# Patient Record
Sex: Male | Born: 1980 | Race: White | Hispanic: No | Marital: Married | Smoking: Current every day smoker
Health system: Southern US, Community
[De-identification: ages and names within clinical notes are randomized; demographics above are authoritative.]

## PROBLEM LIST (undated history)

## (undated) HISTORY — PX: APPENDECTOMY: SHX54

## (undated) HISTORY — PX: BACK SURGERY: SHX140

---

## 2015-04-14 ENCOUNTER — Encounter: Payer: Self-pay | Admitting: Emergency Medicine

## 2015-04-14 ENCOUNTER — Emergency Department
Admission: EM | Admit: 2015-04-14 | Discharge: 2015-04-14 | Disposition: A | Discharge status: Home / Self Care | Payer: Self-pay | Attending: Emergency Medicine | Admitting: Emergency Medicine

## 2015-04-14 DIAGNOSIS — Z720 Tobacco use: Secondary | ICD-10-CM | POA: Insufficient documentation

## 2015-04-14 DIAGNOSIS — L0291 Cutaneous abscess, unspecified: Secondary | ICD-10-CM

## 2015-04-14 DIAGNOSIS — L02415 Cutaneous abscess of right lower limb: Secondary | ICD-10-CM | POA: Insufficient documentation

## 2015-04-14 MED ORDER — SULFAMETHOXAZOLE-TRIMETHOPRIM 800-160 MG PO TABS
2.0000 | ORAL_TABLET | Freq: Once | ORAL | Status: AC
  Administered 2015-04-14: 2 via ORAL
  Filled 2015-04-14: qty 2

## 2015-04-14 MED ORDER — LIDOCAINE HCL (PF) 1 % IJ SOLN
INTRAMUSCULAR | Status: AC
  Administered 2015-04-14: 5 mL via INTRADERMAL
  Filled 2015-04-14: qty 5

## 2015-04-14 MED ORDER — LIDOCAINE HCL (PF) 1 % IJ SOLN
5.0000 mL | Freq: Once | INTRAMUSCULAR | Status: AC
  Administered 2015-04-14: 5 mL via INTRADERMAL

## 2015-04-14 MED ORDER — SULFAMETHOXAZOLE-TRIMETHOPRIM 800-160 MG PO TABS: 2.0000 | ORAL_TABLET | Freq: Two times a day (BID) | ORAL | Status: AC

## 2015-04-14 NOTE — ED Provider Notes (Signed)
[facility] Emergency Department Provider Note  ____________________________________________  Time seen: Approximately 3:07 AM  I have reviewed the triage vital signs and the nursing notes.   HISTORY  Chief Complaint Abscess    HPI Vincent Vazquez is a 34 y.o. male who presents to the ED from home with a chief complaint of abscess. Patient states he has a "recurring cyst" on the back of his right leg that he has had I&D'd about 6 times previously.Noted pain and swelling approximately one week ago. Denies fever, vomiting or drainage. Nothing makes the pain or swelling better or worse.   History reviewed. No pertinent past medical history.  Denies diabetes  There are no active problems to display for this patient.   Past Surgical History  Procedure Laterality Date  . Back surgery    . Appendectomy      No current outpatient prescriptions on file.  Allergies Review of patient's allergies indicates no known allergies.  History reviewed. No pertinent family history.  Social History Social History  Substance Use Topics  . Smoking status: Current Every Day Smoker -- 1.00 packs/day    Types: Cigarettes  . Smokeless tobacco: None  . Alcohol Use: Yes    Review of Systems Constitutional: No fever/chills Eyes: No visual changes. ENT: No sore throat. Cardiovascular: Denies chest pain. Respiratory: Denies shortness of breath. Gastrointestinal: No abdominal pain.  No nausea, no vomiting.  No diarrhea.  No constipation. Genitourinary: Negative for dysuria. Musculoskeletal: Negative for back pain. Skin: Positive for abscess. Negative for rash. Neurological: Negative for headaches, focal weakness or numbness.  10-point ROS otherwise negative.  ____________________________________________   PHYSICAL EXAM:  VITAL SIGNS: ED Triage Vitals  Enc Vitals Group     BP 04/14/15 0230 135/87 mmHg     Pulse Rate 04/14/15 0230 73     Resp 04/14/15 0230 18     Temp 04/14/15 0230 97.8 F (36.6 C)     Temp Source 04/14/15 0230 Oral     SpO2 04/14/15 0230 98 %     Weight 04/14/15 0230 175 lb (79.379 kg)     Height 04/14/15 0230  (1.905 m)     Head Cir --      Peak Flow --      Pain Score 04/14/15 0228 7     Pain Loc --      Pain Edu? --      Excl. in GC? --     Constitutional: Alert and oriented. Well appearing and in no acute distress. Eyes: Conjunctivae are normal. PERRL. EOMI. Head: Atraumatic. Nose: No congestion/rhinnorhea. Mouth/Throat: Mucous membranes are moist.  Oropharynx non-erythematous. Neck: No stridor.   Cardiovascular: Normal rate, regular rhythm. Grossly normal heart sounds.  Good peripheral circulation. Respiratory: Normal respiratory effort.  No retractions. Lungs CTAB. Gastrointestinal: Soft and nontender. No distention. No abdominal bruits. No CVA tenderness. Musculoskeletal: Approximately 1 cm area of abscess posterior right thigh. Tiny amount of fluctuance appreciated. No surrounding warmth or erythema. Neurologic:  Normal speech and language. No gross focal neurologic deficits are appreciated. No gait instability. Skin:  Skin is warm, dry and intact. No rash noted. Psychiatric: Mood and affect are normal. Speech and behavior are normal.  ____________________________________________   LABS (all labs ordered are listed, but only abnormal results are displayed)  Labs Reviewed - No data to display ____________________________________________  EKG  None ____________________________________________  RADIOLOGY  None ____________________________________________   PROCEDURES  Procedure(s) performed:   INCISION AND DRAINAGE Performed by: Irean Hong  Consent: Verbal consent obtained. Risks and benefits: risks, benefits and alternatives were discussed Type: abscess  Body area: Right posterior thigh  Anesthesia: local infiltration  Incision was made with a scalpel.  Local anesthetic: lidocaine  1% no epinephrine  Anesthetic total: 3 ml  Complexity: complex Blunt dissection to break up loculations  Drainage: purulent  Drainage amount: small  Packing material: 1/4 in iodoform gauze  Patient tolerance: Patient tolerated the procedure well with no immediate complications.    Critical Care performed: No  ____________________________________________   INITIAL IMPRESSION / ASSESSMENT AND PLAN / ED COURSE  Pertinent labs & imaging results that were available during my care of the patient were reviewed by me and considered in my medical decision making (see chart for details).  34 year old male who presents with small abscess to posterior right thigh s/p I&D. Will place on antibiotics and patient will follow-up with his PCP. Strict return precautions given. Patient verbalizes understanding and agrees with plan of care. ____________________________________________   FINAL CLINICAL IMPRESSION(S) / ED DIAGNOSES  Final diagnoses:  Abscess      Irean Hong, MD 04/14/15 [id number]

## 2015-04-14 NOTE — Discharge Instructions (Signed)
1. Take antibiotic as prescribed (Bactrim DS 2 tablets twice daily 10 days). 2. Do not replace packing if it falls out. 3. Wound check in 2 days. 4. Return to the ER for worsening symptoms, fever, persistent vomiting or other concerns.  Abscess An abscess is an infected area that contains a collection of pus and debris.It can occur in almost any part of the body. An abscess is also known as a furuncle or boil. CAUSES  An abscess occurs when tissue gets infected. This can occur from blockage of oil or sweat glands, infection of hair follicles, or a minor injury to the skin. As the body tries to fight the infection, pus collects in the area and creates pressure under the skin. This pressure causes pain. People with weakened immune systems have difficulty fighting infections and get certain abscesses more often.  SYMPTOMS Usually an abscess develops on the skin and becomes a painful mass that is red, warm, and tender. If the abscess forms under the skin, you may feel a moveable soft area under the skin. Some abscesses break open (rupture) on their own, but most will continue to get worse without care. The infection can spread deeper into the body and eventually into the bloodstream, causing you to feel ill.  DIAGNOSIS  Your caregiver will take your medical history and perform a physical exam. A sample of fluid may also be taken from the abscess to determine what is causing your infection. TREATMENT  Your caregiver may prescribe antibiotic medicines to fight the infection. However, taking antibiotics alone usually does not cure an abscess. Your caregiver may need to make a small cut (incision) in the abscess to drain the pus. In some cases, gauze is packed into the abscess to reduce pain and to continue draining the area. HOME CARE INSTRUCTIONS   Only take over-the-counter or prescription medicines for pain, discomfort, or fever as directed by your caregiver.  If you were prescribed antibiotics,  take them as directed. Finish them even if you start to feel better.  If gauze is used, follow your caregiver's directions for changing the gauze.  To avoid spreading the infection:  Keep your draining abscess covered with a bandage.  Wash your hands well.  Do not share personal care items, towels, or whirlpools with others.  Avoid skin contact with others.  Keep your skin and clothes clean around the abscess.  Keep all follow-up appointments as directed by your caregiver. SEEK MEDICAL CARE IF:   You have increased pain, swelling, redness, fluid drainage, or bleeding.  You have muscle aches, chills, or a general ill feeling.  You have a fever. MAKE SURE YOU:   Understand these instructions.  Will watch your condition.  Will get help right away if you are not doing well or get worse.   This information is not intended to replace advice given to you by your health care provider. Make sure you discuss any questions you have with your health care provider.   Document Released: 04/01/2005 Document Revised: 12/22/2011 Document Reviewed: 09/04/2011 Elsevier Interactive Patient Education 2016 [employer]  Incision and Drainage Incision and drainage is a procedure in which a sac-like structure (cystic structure) is opened and drained. The area to be drained usually contains material such as pus, fluid, or blood.  LET YOUR CAREGIVER KNOW ABOUT:   Allergies to medicine.  Medicines taken, including vitamins, herbs, eyedrops, over-the-counter medicines, and creams.  Use of steroids (by mouth or creams).  Previous problems with anesthetics or numbing  medicines.  History of bleeding problems or blood clots.  Previous surgery.  Other health problems, including diabetes and kidney problems.  Possibility of pregnancy, if this applies. RISKS AND COMPLICATIONS  Pain.  Bleeding.  Scarring.  Infection. BEFORE THE PROCEDURE  You may need to have an ultrasound or other  imaging tests to see how large or deep your cystic structure is. Blood tests may also be used to determine if you have an infection or how severe the infection is. You may need to have a tetanus shot. PROCEDURE  The affected area is cleaned with a cleaning fluid. The cyst area will then be numbed with a medicine (local anesthetic). A small incision will be made in the cystic structure. A syringe or catheter may be used to drain the contents of the cystic structure, or the contents may be squeezed out. The area will then be flushed with a cleansing solution. After cleansing the area, it is often gently packed with a gauze or another wound dressing. Once it is packed, it will be covered with gauze and tape or some other type of wound dressing. AFTER THE PROCEDURE   Often, you will be allowed to go home right after the procedure.  You may be given antibiotic medicine to prevent or heal an infection.  If the area was packed with gauze or some other wound dressing, you will likely need to come back in 1 to 2 days to get it removed.  The area should heal in about 14 days.   This information is not intended to replace advice given to you by your health care provider. Make sure you discuss any questions you have with your health care provider.   Document Released: 12/16/2000 Document Revised: 12/22/2011 Document Reviewed: 08/17/2011 Elsevier Interactive Patient Education [employer]

## 2015-04-14 NOTE — ED Notes (Addendum)
Pt says he has a "recurring cyst" on the back of his right leg that he needs to have I & D'd; has had to have drained about 6 times previously; presently no drainage

## 2015-04-14 NOTE — ED Notes (Signed)
Sung, MD at bedside. 

## 2015-07-04 ENCOUNTER — Emergency Department: Payer: Self-pay

## 2015-07-04 ENCOUNTER — Emergency Department
Admission: EM | Admit: 2015-07-04 | Discharge: 2015-07-04 | Disposition: A | Discharge status: Home / Self Care | Payer: Self-pay | Attending: Emergency Medicine | Admitting: Emergency Medicine

## 2015-07-04 DIAGNOSIS — F17210 Nicotine dependence, cigarettes, uncomplicated: Secondary | ICD-10-CM | POA: Insufficient documentation

## 2015-07-04 DIAGNOSIS — R609 Edema, unspecified: Secondary | ICD-10-CM

## 2015-07-04 DIAGNOSIS — N4342 Spermatocele of epididymis, multiple: Secondary | ICD-10-CM | POA: Insufficient documentation

## 2015-07-04 DIAGNOSIS — Z792 Long term (current) use of antibiotics: Secondary | ICD-10-CM | POA: Insufficient documentation

## 2015-07-04 LAB — URINALYSIS COMPLETE WITH MICROSCOPIC (ARMC ONLY)
BILIRUBIN URINE: NEGATIVE
Bacteria, UA: NONE SEEN
GLUCOSE, UA: NEGATIVE mg/dL
Hgb urine dipstick: NEGATIVE
KETONES UR: NEGATIVE mg/dL
Leukocytes, UA: NEGATIVE
NITRITE: NEGATIVE
PROTEIN: NEGATIVE mg/dL
SPECIFIC GRAVITY, URINE: 1.001 — AB (ref 1.005–1.030)
Squamous Epithelial / LPF: NONE SEEN
WBC UA: NONE SEEN WBC/hpf (ref 0–5)
pH: 6 (ref 5.0–8.0)

## 2015-07-04 MED ORDER — NAPROXEN 500 MG PO TABS: 500.0000 mg | ORAL_TABLET | Freq: Two times a day (BID) | ORAL | Status: AC

## 2015-07-04 NOTE — ED Provider Notes (Signed)
[facility] Emergency Department Provider Note  ____________________________________________  Time seen: Approximately 8:17 PM  I have reviewed the triage vital signs and the nursing notes.   HISTORY  Chief Complaint Testicle Pain  Patient complaining of right testicle pain for 1 month. He denies any provocative incident. He is noticing edema in the right scrotum. He denies any dysuria or penial discharge. He denies any inguinal swelling or pain. Patient rates the pain as a 5/10 and describes the pain as "dull to sharp". No palliative measures taken this complaint.  HPI Vincent AxeBrian Hutmacher is a 34 y.o. male patient complaining of right testicle pain for 1 month. Patient denies any provocative incident. Patient noticed increased edema to the right scrotum. Patient denies any dysuria or penial discharge. Patient denies any inguinal swelling or pain. Patient rated his scrotum pain as a 5/10. Patient describes his pain as "dull to sharp". No palliative measures taken for this complaint.   History reviewed. No pertinent past medical history.  There are no active problems to display for this patient.   Past Surgical History  Procedure Laterality Date  . Back surgery    . Appendectomy      Current Outpatient Rx  Name  Route  Sig  Dispense  Refill  . naproxen (NAPROSYN) 500 MG tablet   Oral   Take 1 tablet (500 mg total) by mouth 2 (two) times daily with a meal.   20 tablet   00   . sulfamethoxazole-trimethoprim (BACTRIM DS,SEPTRA DS) 800-160 MG tablet   Oral   Take 2 tablets by mouth 2 (two) times daily.   40 tablet   0     Allergies Review of patient's allergies indicates no known allergies.  History reviewed. No pertinent family history.  Social History Social History  Substance Use Topics  . Smoking status: Current Every Day Smoker -- 1.00 packs/day    Types: Cigarettes  . Smokeless tobacco: None  . Alcohol Use: Yes    Review of  Systems Constitutional: No fever/chills Eyes: No visual changes. ENT: No sore throat. Cardiovascular: Denies chest pain. Respiratory: Denies shortness of breath. Gastrointestinal: No abdominal pain.  No nausea, no vomiting.  No diarrhea.  No constipation. Genitourinary: Negative for dysuria. Right scrotum pain. Musculoskeletal: Negative for back pain. Skin: Negative for rash. Neurological: Negative for headaches, focal weakness or numbness. 10-point ROS otherwise negative.  ____________________________________________   PHYSICAL EXAM:  VITAL SIGNS: ED Triage Vitals  Enc Vitals Group     BP 07/04/15 1918 125/75 mmHg     Pulse Rate 07/04/15 1918 66     Resp 07/04/15 1918 20     Temp 07/04/15 1918 98 F (36.7 C)     Temp Source 07/04/15 1918 Oral     SpO2 07/04/15 1918 100 %     Weight 07/04/15 1918 175 lb (79.379 kg)     Height 07/04/15 1918 6\' 3"  (1.905 m)     Head Cir --      Peak Flow --      Pain Score 07/04/15 1927 5     Pain Loc --      Pain Edu? --      Excl. in GC? --    Constitutional: Alert and oriented. Well appearing and in no acute distress. Eyes: Conjunctivae are normal. PERRL. EOMI. Head: Atraumatic. Nose: No congestion/rhinnorhea. Mouth/Throat: Mucous membranes are moist.  Oropharynx non-erythematous. Neck: No stridor. No cervical spine tenderness to palpation. Hematological/Lymphatic/Immunilogical: No cervical lymphadenopathy. Cardiovascular: Normal rate, regular  rhythm. Grossly normal heart sounds.  Good peripheral circulation. Respiratory: Normal respiratory effort.  No retractions. Lungs CTAB. Gastrointestinal: Soft and nontender. No distention. No abdominal bruits. No CVA tenderness. Genitourinary: No obvious edema or erythema. Patient has some moderate guarding palpation of the right testis. Musculoskeletal: No lower extremity tenderness nor edema.  No joint effusions. Neurologic:  Normal speech and language. No gross focal neurologic deficits are  appreciated. No gait instability. Skin:  Skin is warm, dry and intact. No rash noted. Psychiatric: Mood and affect are normal. Speech and behavior are normal.  ____________________________________________   LABS (all labs ordered are listed, but only abnormal results are displayed)  Labs Reviewed  URINALYSIS COMPLETEWITH MICROSCOPIC ([facility] ONLY) - Abnormal; Notable for the following:    Color, Urine COLORLESS (*)    APPearance CLEAR (*)    Specific Gravity, Urine 1.001 (*)    All other components within normal limits   ____________________________________________  EKG   ____________________________________________  RADIOLOGY  Ultrasound of testicles reveal only some small spermatoceles. ____________________________________________   PROCEDURES  Procedure(s) performed: None  Critical Care performed: No  ____________________________________________   INITIAL IMPRESSION / ASSESSMENT AND PLAN / ED COURSE  Pertinent labs & imaging results that were available during my care of the patient were reviewed by me and considered in my medical decision making (see chart for details).  Spermatoceles. Advised follow-up with urologist pain persists or worsens. ____________________________________________   FINAL CLINICAL IMPRESSION(S) / ED DIAGNOSES  Final diagnoses:  Edema  Spermatocele of epididymis, multiple      RAVI TUCCILLO, PA-C 07/04/15 2141  Phineas Semen, MD 07/09/15 [phone]

## 2015-07-04 NOTE — Discharge Instructions (Signed)
Testicular Self-Exam  A self-exam of your testicles is looking at and feeling your testicles for abnormal lumps or swelling. Several things can cause swelling, lumps, or pain in your testicles. Some of these causes are:   Injuries.   Puffiness, redness, and soreness (inflammation).   Infection.   Extra fluids around your testicle.   Twisted testicles.   Testicular cancer.  The testicles are easiest to check after warm baths or showers. They are harder to examine when you are cold.   Follow these steps while you are standing:   Hold your penis away from your body.   Roll one testicle between your thumb and finger. Feel the entire testicle.   Roll the other testicle between your thumb and finger. Feel the entire testicle.  Feel for lumps, swelling, or discomfort. A normal testicle is egg shaped. It feels firm. It is smooth and not tender. The spermatic cord feels like a firm spaghetti-like cord. It is at the back of your testicle. Examine the crease between the front of your leg and your abdomen. Feel for any bumps that are tender. These could be enlarged lymph nodes.      This information is not intended to replace advice given to you by your health care provider. Make sure you discuss any questions you have with your health care provider.     Document Released: 09/18/2008 Document Revised: 04/12/2013 Document Reviewed: 12/12/2012  Elsevier Interactive Patient Education 2016 [employer] Inc[employer]

## 2015-07-04 NOTE — ED Notes (Signed)
Pt c/o right testicle pain x 1 month. Pt reports no urinary s/s. Pt reports increase in pain today while working. Pt denies injury.

## 2016-12-07 ENCOUNTER — Encounter: Payer: Self-pay | Admitting: *Deleted

## 2016-12-07 DIAGNOSIS — M25552 Pain in left hip: Secondary | ICD-10-CM | POA: Insufficient documentation

## 2016-12-07 DIAGNOSIS — F17210 Nicotine dependence, cigarettes, uncomplicated: Secondary | ICD-10-CM | POA: Insufficient documentation

## 2016-12-07 DIAGNOSIS — M79605 Pain in left leg: Secondary | ICD-10-CM | POA: Insufficient documentation

## 2016-12-07 NOTE — ED Triage Notes (Signed)
Pt reports left hip pain radiating down leg for the last month, pt denies injury, pt reports pain is worse today

## 2016-12-08 ENCOUNTER — Emergency Department: Payer: Self-pay

## 2016-12-08 ENCOUNTER — Emergency Department
Admission: EM | Admit: 2016-12-08 | Discharge: 2016-12-08 | Disposition: A | Discharge status: Home / Self Care | Payer: Self-pay | Attending: Emergency Medicine | Admitting: Emergency Medicine

## 2016-12-08 DIAGNOSIS — M79606 Pain in leg, unspecified: Secondary | ICD-10-CM

## 2016-12-08 DIAGNOSIS — M25552 Pain in left hip: Secondary | ICD-10-CM

## 2016-12-08 MED ORDER — KETOROLAC TROMETHAMINE 60 MG/2ML IM SOLN
60.0000 mg | Freq: Once | INTRAMUSCULAR | Status: AC
  Administered 2016-12-08: 60 mg via INTRAMUSCULAR
  Filled 2016-12-08: qty 2

## 2016-12-08 MED ORDER — ETODOLAC 200 MG PO CAPS: 200.0000 mg | ORAL_CAPSULE | Freq: Three times a day (TID) | ORAL | 0 refills | Status: AC

## 2016-12-08 MED ORDER — TRAMADOL HCL 50 MG PO TABS: 50.0000 mg | ORAL_TABLET | Freq: Four times a day (QID) | ORAL | 0 refills | Status: AC | PRN

## 2016-12-08 MED ORDER — LIDOCAINE 5 % EX PTCH
1.0000 | MEDICATED_PATCH | CUTANEOUS | Status: DC
  Administered 2016-12-08: 1 via TRANSDERMAL
  Filled 2016-12-08: qty 1

## 2016-12-08 NOTE — ED Provider Notes (Signed)
[facility] Emergency Department Provider Note   ____________________________________________   First MD Initiated Contact with Patient 12/08/16 0107     (approximate)  I have reviewed the triage vital signs and the nursing notes.   HISTORY  Chief Complaint Leg Pain    HPI Vincent Vazquez is a 36 y.o. male who comes into the hospital today with left hip pain. The patient reports he woke up a month ago with what he thought was a charley horse in his left leg and he's been having pain in his left leg ever since. He states is been getting worse and worse over the last week and he couldn't get off of the Morning without a lot of effort. The patient has been taking Tylenol but it has not been helpful. He reports that he hasn't gone to see anyone and does not have a primary care physician. He states it is a sharp pain that started in his left hip and in his back and it seems to be going down his legs. The patient denies any trauma and states that he's never had this pain before. The patient has had back surgery in the past. He reports that his leg and shin as well as his ankle and his knee and up. The patient rates his pain 8 out of 10 in intensity. He is here tonight for evaluation.   History reviewed. No pertinent past medical history.  There are no active problems to display for this patient.   Past Surgical History:  Procedure Laterality Date  . APPENDECTOMY    . BACK SURGERY      Prior to Admission medications   Medication Sig Start Date End Date Taking? Authorizing Provider  etodolac (LODINE) 200 MG capsule Take 1 capsule (200 mg total) by mouth every 8 (eight) hours. 12/08/16   Rebecka ApleyWebster, Brittany Amirault P, MD  naproxen (NAPROSYN) 500 MG tablet Take 1 tablet (500 mg total) by mouth 2 (two) times daily with a meal. 07/04/15   Joni ReiningSmith, Ronald K, PA-C  sulfamethoxazole-trimethoprim (BACTRIM DS,SEPTRA DS) 800-160 MG tablet Take 2 tablets by mouth 2 (two) times daily.  04/14/15   Irean HongSung, Jade J, MD  traMADol (ULTRAM) 50 MG tablet Take 1 tablet (50 mg total) by mouth every 6 (six) hours as needed. 12/08/16   Rebecka ApleyWebster, Mir Fullilove P, MD    Allergies Patient has no known allergies.  No family history on file.  Social History Social History  Substance Use Topics  . Smoking status: Current Every Day Smoker    Packs/day: 1.00    Types: Cigarettes  . Smokeless tobacco: Not on file  . Alcohol use Yes    Review of Systems  Constitutional: No fever/chills Eyes: No visual changes. ENT: No sore throat. Cardiovascular: Denies chest pain. Respiratory: Denies shortness of breath. Gastrointestinal: No abdominal pain.  No nausea, no vomiting.  No diarrhea.  No constipation. Genitourinary: Negative for dysuria. Musculoskeletal: Left hip pain, back pain, leg pain Skin: Negative for rash. Neurological: Negative for headaches, focal weakness or numbness.   ____________________________________________   PHYSICAL EXAM:  VITAL SIGNS: ED Triage Vitals  Enc Vitals Group     BP 12/07/16 2305 137/77     Pulse Rate 12/07/16 2305 67     Resp 12/07/16 2305 18     Temp 12/07/16 2305 98.1 F (36.7 C)     Temp Source 12/07/16 2305 Oral     SpO2 12/07/16 2305 100 %     Weight 12/07/16 2305 175 lb (  79.4 kg)     Height 12/07/16 2305 6\' 3"  (1.905 m)     Head Circumference --      Peak Flow --      Pain Score 12/07/16 2308 8     Pain Loc --      Pain Edu? --      Excl. in GC? --     Constitutional: Alert and oriented. Well appearing and in Mild distress. Eyes: Conjunctivae are normal. PERRL. EOMI. Head: Atraumatic. Nose: No congestion/rhinnorhea. Mouth/Throat: Mucous membranes are moist.  Oropharynx non-erythematous. Cardiovascular: Normal rate, regular rhythm. Grossly normal heart sounds.  Good peripheral circulation. Respiratory: Normal respiratory effort.  No retractions. Lungs CTAB. Gastrointestinal: Soft and nontender. No distention. Positive bowel  sounds Musculoskeletal: Left SI joint tenderness to palpation with some pain in the left hip with movement of the patient's left leg.  Neurologic:  Normal speech and language.  Skin:  Skin is warm, dry and intact.  Psychiatric: Mood and affect are normal.   ____________________________________________   LABS (all labs ordered are listed, but only abnormal results are displayed)  Labs Reviewed - No data to display ____________________________________________  EKG  none ____________________________________________  RADIOLOGY  US Venous Img Lower Unilateral Left  Result Date: 12/08/2016 CLINICAL DATA:  Left leg pain for 1 month. EXAM: LEFT LOWER EXTREMITY VENOUS DOPPLER ULTRASOUND TECHNIQUE: Gray-scale sonography with graded compression, as well as color Doppler and duplex ultrasound were performed to evaluate the lower extremity deep venous systems from the level of the common femoral vein and including the common femoral, femoral, profunda femoral, popliteal and calf veins including the posterior tibial, peroneal and gastrocnemius veins when visible. The superficial great saphenous vein was also interrogated. Spectral Doppler was utilized to evaluate flow at rest and with distal augmentation maneuvers in the common femoral, femoral and popliteal veins. COMPARISON:  None. FINDINGS: Contralateral Common Femoral Vein: Respiratory phasicity is normal and symmetric with the symptomatic side. No evidence of thrombus. Normal compressibility. Common Femoral Vein: No evidence of thrombus. Normal compressibility, respiratory phasicity and response to augmentation. Saphenofemoral Junction: No evidence of thrombus. Normal compressibility and flow on color Doppler imaging. Profunda Femoral Vein: No evidence of thrombus. Normal compressibility and flow on color Doppler imaging. Femoral Vein: No evidence of thrombus. Normal compressibility, respiratory phasicity and response to augmentation. Popliteal Vein: No  evidence of thrombus. Normal compressibility, respiratory phasicity and response to augmentation. Calf Veins: No evidence of thrombus. Normal compressibility and flow on color Doppler imaging. Superficial Great Saphenous Vein: No evidence of thrombus. Normal compressibility and flow on color Doppler imaging. Venous Reflux:  None. Other Findings:  None. IMPRESSION: No evidence of DVT within the left lower extremity. Electronically Signed   By: Rubye Oaks M.D.   On: 12/08/2016 02:37   Dg Hip Unilat W Or Wo Pelvis 2-3 Views Left  Result Date: 12/08/2016 CLINICAL DATA:  Left hip pain for 1 month.  No known injury. EXAM: DG HIP (WITH OR WITHOUT PELVIS) 2-3V LEFT COMPARISON:  None. FINDINGS: The cortical margins of the bony pelvis and left hip are intact. No fracture. Pubic symphysis and sacroiliac joints are congruent. Both femoral heads are well-seated in the respective acetabula. Small left os acetabula. IMPRESSION: Small left os acetabula, typically incidental. No acute or suspicious osseous abnormality of the pelvis or left hip. Electronically Signed   By: Rubye Oaks M.D.   On: 12/08/2016 02:24    ____________________________________________   PROCEDURES  Procedure(s) performed: None  Procedures  Critical Care performed:  No  ____________________________________________   INITIAL IMPRESSION / ASSESSMENT AND PLAN / ED COURSE  Pertinent labs & imaging results that were available during my care of the patient were reviewed by me and considered in my medical decision making (see chart for details).  This is a 36 year old male who comes into the hospital today with some left hip pain for about a month. The patient's x-ray is unremarkable aside from a small left os acetabula. The patient also had a DVT study that was negative. I gave the patient a shot of Toradol as well as a Lidoderm patch and he reports that his pain is improved. I informed the patient that given this hip pain he  should follow up with orthopedic surgery for further evaluation. The patient understands and agrees with the plan as stated. He'll be discharged home to follow up with orthopedic surgery.  Clinical Course as of Dec 09 426  Tue Dec 08, 2016  0242 No evidence of DVT within the left lower extremity. US Venous Img Lower Unilateral Left [AW]  0242 Small left os acetabula, typically incidental. No acute or suspicious osseous abnormality of the pelvis or left hip.   DG Hip Unilat W or Wo Pelvis 2-3 Views Left [AW]    Clinical Course User Index [AW] Rebecka Apley, MD     ____________________________________________   FINAL CLINICAL IMPRESSION(S) / ED DIAGNOSES  Final diagnoses:  Leg pain  Pain of left hip joint      NEW MEDICATIONS STARTED DURING THIS VISIT:  New Prescriptions   ETODOLAC (LODINE) 200 MG CAPSULE    Take 1 capsule (200 mg total) by mouth every 8 (eight) hours.   TRAMADOL (ULTRAM) 50 MG TABLET    Take 1 tablet (50 mg total) by mouth every 6 (six) hours as needed.     Note:  This document was prepared using Dragon voice recognition software and may include unintentional dictation errors.    Rebecka Apley, MD 12/08/16 [phone]

## 2017-03-17 ENCOUNTER — Encounter: Payer: Self-pay | Admitting: Emergency Medicine

## 2017-03-17 ENCOUNTER — Emergency Department
Admission: EM | Admit: 2017-03-17 | Discharge: 2017-03-17 | Disposition: A | Discharge status: Home / Self Care | Payer: Self-pay | Attending: Emergency Medicine | Admitting: Emergency Medicine

## 2017-03-17 ENCOUNTER — Emergency Department: Payer: Self-pay

## 2017-03-17 DIAGNOSIS — S0083XA Contusion of other part of head, initial encounter: Secondary | ICD-10-CM

## 2017-03-17 DIAGNOSIS — Y929 Unspecified place or not applicable: Secondary | ICD-10-CM | POA: Insufficient documentation

## 2017-03-17 DIAGNOSIS — F17200 Nicotine dependence, unspecified, uncomplicated: Secondary | ICD-10-CM | POA: Insufficient documentation

## 2017-03-17 DIAGNOSIS — W19XXXA Unspecified fall, initial encounter: Secondary | ICD-10-CM | POA: Insufficient documentation

## 2017-03-17 DIAGNOSIS — S0181XA Laceration without foreign body of other part of head, initial encounter: Secondary | ICD-10-CM | POA: Insufficient documentation

## 2017-03-17 DIAGNOSIS — Y999 Unspecified external cause status: Secondary | ICD-10-CM | POA: Insufficient documentation

## 2017-03-17 DIAGNOSIS — Y939 Activity, unspecified: Secondary | ICD-10-CM | POA: Insufficient documentation

## 2017-03-17 MED ORDER — TRAMADOL HCL 50 MG PO TABS: 50.0000 mg | ORAL_TABLET | Freq: Four times a day (QID) | ORAL | 0 refills | Status: AC | PRN

## 2017-03-17 MED ORDER — BACITRACIN ZINC 500 UNIT/GM EX OINT
TOPICAL_OINTMENT | Freq: Two times a day (BID) | CUTANEOUS | Status: DC
  Administered 2017-03-17: 1 via TOPICAL
  Filled 2017-03-17: qty 1.8

## 2017-03-17 MED ORDER — LIDOCAINE HCL (PF) 1 % IJ SOLN
INTRAMUSCULAR | Status: AC
  Filled 2017-03-17: qty 5

## 2017-03-17 MED ORDER — TRAMADOL HCL 50 MG PO TABS
50.0000 mg | ORAL_TABLET | Freq: Once | ORAL | Status: AC
  Administered 2017-03-17: 50 mg via ORAL
  Filled 2017-03-17: qty 1

## 2017-03-17 NOTE — ED Provider Notes (Signed)
[facility] Emergency Department Provider Note   ____________________________________________   First MD Initiated Contact with Patient 03/17/17 1112     (approximate)  I have reviewed the triage vital signs and the nursing notes.   HISTORY  Chief Complaint Laceration and Fall    HPI Vincent Vazquez is a 36 y.o. male patient presents with a forehead laceration secondary to a fall last night. Patient  was drinking and did not remember the fall. He said he awakened this morning with a laceration to the forehead. Patient also complaining of headache. Patient denies vertigo or vision disturbance. Patient denies nausea or vomiting.Patient states no active bleeding.   History reviewed. No pertinent past medical history.  There are no active problems to display for this patient.   Past Surgical History:  Procedure Laterality Date  . APPENDECTOMY    . BACK SURGERY      Prior to Admission medications   Medication Sig Start Date End Date Taking? Authorizing Provider  etodolac (LODINE) 200 MG capsule Take 1 capsule (200 mg total) by mouth every 8 (eight) hours. 12/08/16   Rebecka Apley, MD  naproxen (NAPROSYN) 500 MG tablet Take 1 tablet (500 mg total) by mouth 2 (two) times daily with a meal. 07/04/15   Joni Reining, PA-C  sulfamethoxazole-trimethoprim (BACTRIM DS,SEPTRA DS) 800-160 MG tablet Take 2 tablets by mouth 2 (two) times daily. 04/14/15   Irean Hong, MD  traMADol (ULTRAM) 50 MG tablet Take 1 tablet (50 mg total) by mouth every 6 (six) hours as needed. 12/08/16   Rebecka Apley, MD  traMADol (ULTRAM) 50 MG tablet Take 1 tablet (50 mg total) by mouth every 6 (six) hours as needed for moderate pain. 03/17/17   Joni Reining, PA-C    Allergies Patient has no known allergies.  No family history on file.  Social History Social History  Substance Use Topics  . Smoking status: Current Every Day Smoker    Packs/day: 1.00    Types: Cigarettes    . Smokeless tobacco: Not on file  . Alcohol use Yes    Review of Systems Constitutional: No fever/chills Eyes: No visual changes. ENT: No sore throat. Cardiovascular: Denies chest pain. Respiratory: Denies shortness of breath. Gastrointestinal: No abdominal pain.  No nausea, no vomiting.  No diarrhea.  No constipation. Genitourinary: Negative for dysuria. Musculoskeletal: Negative for back pain. Skin: Negative for rash. Neurological: Positive for headaches, denies focal weakness or numbness.   ____________________________________________   PHYSICAL EXAM:  VITAL SIGNS: ED Triage Vitals  Enc Vitals Group     BP 03/17/17 0948 123/78     Pulse Rate 03/17/17 0948 75     Resp 03/17/17 0948 20     Temp 03/17/17 0948 98.2 F (36.8 C)     Temp Source 03/17/17 0948 Oral     SpO2 03/17/17 0948 99 %     Weight 03/17/17 0949 175 lb (79.4 kg)     Height 03/17/17 0949  (1.905 m)     Head Circumference --      Peak Flow --      Pain Score 03/17/17 0947 4     Pain Loc --      Pain Edu? --      Excl. in GC? --     Constitutional: Alert and oriented. Well appearing and in no acute distress. Eyes: Conjunctivae are normal. PERRL. EOMI. Head: Atraumatic. Forehead hematoma Nose: No congestion/rhinnorhea. Mouth/Throat: Mucous membranes are moist.  Oropharynx non-erythematous. Neck: No stridor.  No cervical spine tenderness to palpation. Hematological/Lymphatic/Immunilogical: No cervical lymphadenopathy. Cardiovascular: Normal rate, regular rhythm. Grossly normal heart sounds.  Good peripheral circulation. Respiratory: Normal respiratory effort.  No retractions. Lungs CTAB. Gastrointestinal: Soft and nontender. No distention. No abdominal bruits. No CVA tenderness. Musculoskeletal: No lower extremity tenderness nor edema.  No joint effusions. Neurologic:  Normal speech and language. No gross focal neurologic deficits are appreciated. No gait instability. Skin:  Skin is warm, dry  and intact. No rash noted. 2.5 cm laceration above right eyebrow. Hematoma to the forehead. Psychiatric: Mood and affect are normal. Speech and behavior are normal.  ____________________________________________   LABS (all labs ordered are listed, but only abnormal results are displayed)  Labs Reviewed - No data to display ____________________________________________  EKG   ____________________________________________  RADIOLOGY  Ct Head Wo Contrast  Result Date: 03/17/2017 CLINICAL DATA:  Pain following fall.  Transient amnesia EXAM: CT HEAD WITHOUT CONTRAST TECHNIQUE: Contiguous axial images were obtained from the base of the skull through the vertex without intravenous contrast. COMPARISON:  None. FINDINGS: Brain: The ventricles are normal in size and configuration. There is no intracranial mass, hemorrhage, extra-axial fluid collection, or midline shift. Gray-white compartments appear normal. Vascular: No hyperdense vessel. No appreciable arterial vascular calcification. Skull: There is a rather minimal right frontal scalp hematoma. Bony calvarium appears intact. Sinuses/Orbits: There is mucosal thickening in multiple ethmoid air cells bilaterally. Opacification is noted in several ethmoid air cells as well. There is slight mucosal thickening in each anterior sphenoid region. Other visualized paranasal sinuses are clear. Visualized orbits appear symmetric bilaterally. Other: Visualized mastoid air cells are clear peer IMPRESSION: Minimal right frontal scalp hematoma. No fracture. No intracranial mass, hemorrhage, or extra-axial fluid collection. Gray-white compartments appear normal. There are foci of paranasal sinus disease, primarily in the ethmoid regions. Electronically Signed   By: Bretta BangWilliam  Woodruff III M.D.   On: 03/17/2017 10:23    ____________________________________________   PROCEDURES  Procedure(s) performed: LACERATION REPAIR Performed by: Joni Reiningonald K Heupel Authorized by:  Joni Reiningonald K Marchetti Consent: Verbal consent obtained. Risks and benefits: risks, benefits and alternatives were discussed Consent given by: patient Patient identity confirmed: provided demographic data Prepped and Draped in normal sterile fashion Wound explored  Laceration Location: Left eyebrow  Laceration Length: 2.5 cm  No Foreign Bodies seen or palpated  Anesthesia: local infiltration  Local anesthetic: lidocaine 1% without epinephrine  Anesthetic total: 5 ml  Irrigation method: syringe Amount of cleaning: standard  Skin closure: 5-0 nylon   Number of sutures: 7   Technique: Interrupted Patient tolerance: Patient tolerated the procedure well with no immediate complications.    Procedures  Critical Care performed: No  ____________________________________________   INITIAL IMPRESSION / ASSESSMENT AND PLAN / ED COURSE  Pertinent labs & imaging results that were available during my care of the patient were reviewed by me and considered in my medical decision making (see chart for details).  Facial pain secondary to contusion and laceration. Discussed negative is CT findings with patient. Patient will was cleaned and sutured. Patient given discharge Instructions. Patient advised return back in 5 days for suture removal.      ____________________________________________   FINAL CLINICAL IMPRESSION(S) / ED DIAGNOSES  Final diagnoses:  Facial laceration, initial encounter  Contusion of face, initial encounter      NEW MEDICATIONS STARTED DURING THIS VISIT:  New Prescriptions   TRAMADOL (ULTRAM) 50 MG TABLET    Take 1 tablet (50 mg total) by  mouth every 6 (six) hours as needed for moderate pain.     Note:  This document was prepared using Dragon voice recognition software and may include unintentional dictation errors.    Hilario, [place], PA-C 03/17/17 1206    Nita Sickle, MD 03/17/17 [phone]

## 2017-03-17 NOTE — ED Notes (Signed)
Bandage placed over laceration after bacitracin applied.

## 2017-03-17 NOTE — ED Notes (Signed)
See triage note  Presents s/p fall  Unsure of how he  Larey SeatFell  Woke up with laceration to head

## 2017-03-17 NOTE — ED Triage Notes (Signed)
Pt reports that he thinks he fell last night but does not remember anything. Pt wife reports that he was last seen and all good around 11pm. Pt wife reports this am when she woke up he was in the house in the bed asleep. Pt got up and saw laceration in head and asked his wife for help. Pt does not know what happened all he knows is there is blood on the porch and he has a headache and a laceration on his head. Bleeding controlled at this time.   Pt reports was drinking beer last pm.

## 2019-03-14 IMAGING — CT CT HEAD W/O CM
3 series · 16 of 47 positions shown, 19 images · non-contrast
Comparison: None.

CLINICAL DATA: Pain following fall.  Transient amnesia

EXAM:
CT HEAD WITHOUT CONTRAST
TECHNIQUE: Contiguous axial images were obtained from the base of the skull
through the vertex without intravenous contrast.

[Series 2: head wo · axial · 0.44mm/px · z∈[+559,+684]mm · 10 of 31 slices shown, 13 images]
[im 3/31  brain]
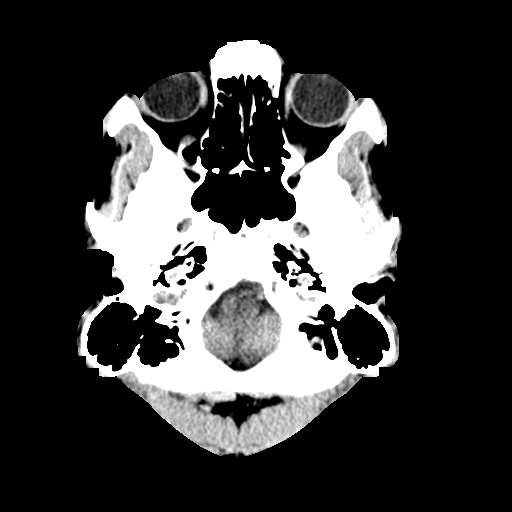
[im 3/31  bone]
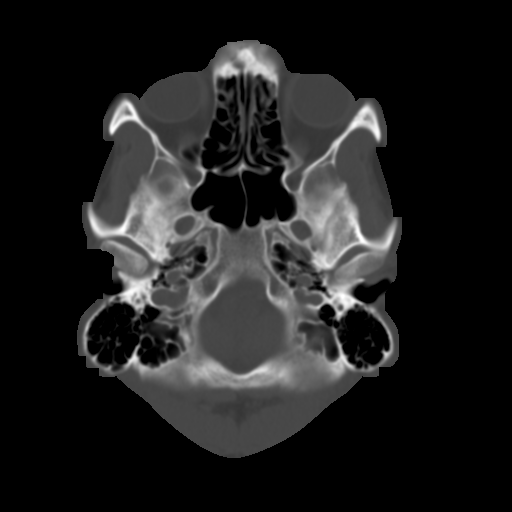
[im 6/31  brain]
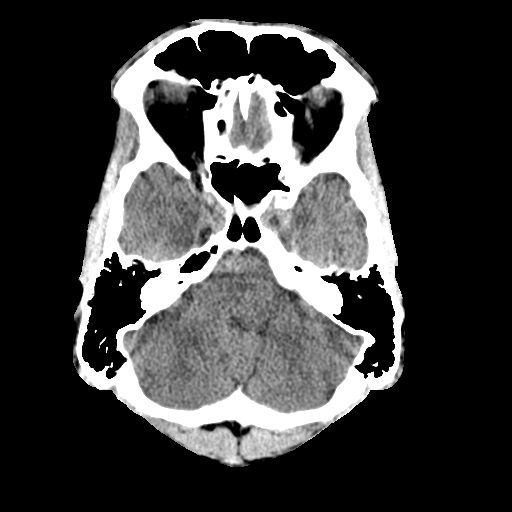
[im 9/31  brain]
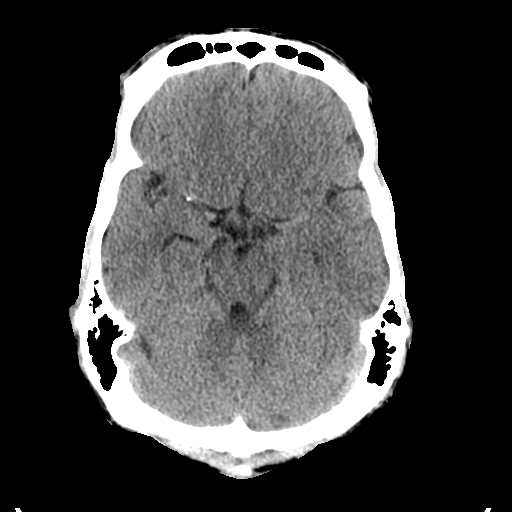
[im 11/31  brain]
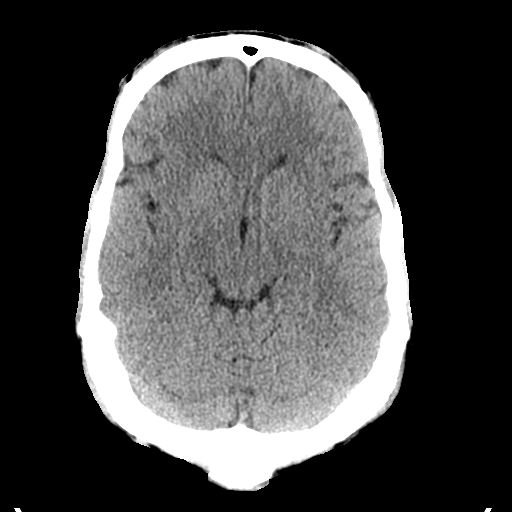
[im 14/31  brain]
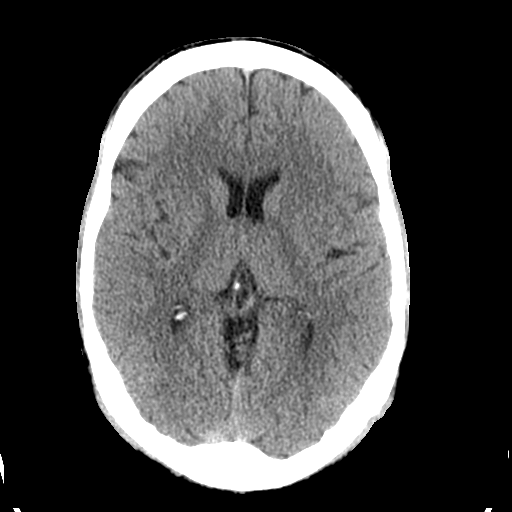
[im 14/31  bone]
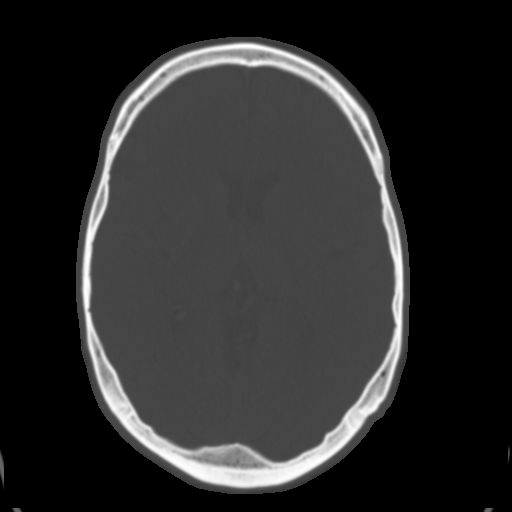
[im 17/31  brain]
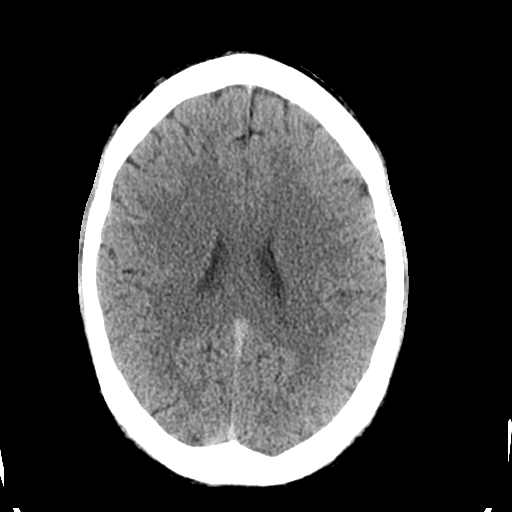
[im 20/31  brain]
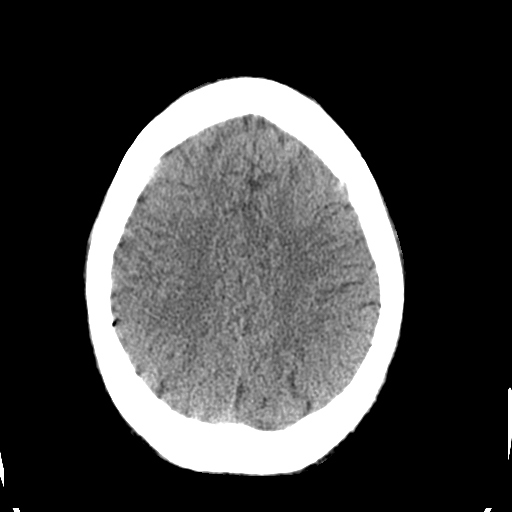
[im 23/31  brain]
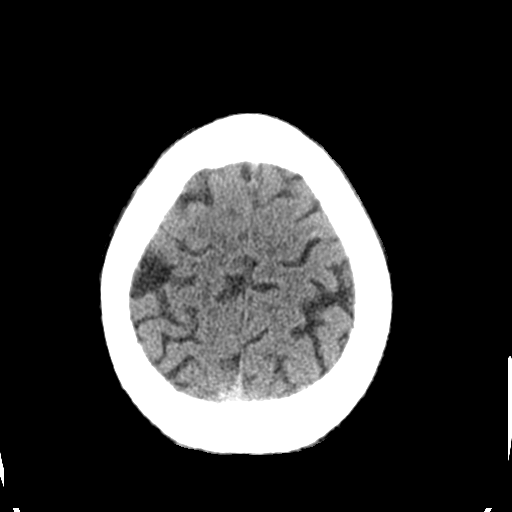
[im 25/31  brain]
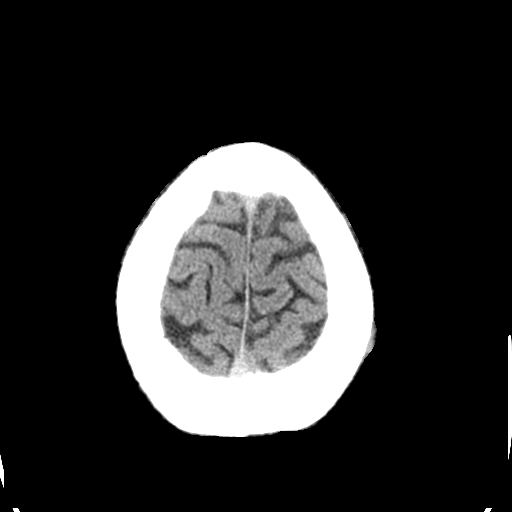
[im 25/31  bone]
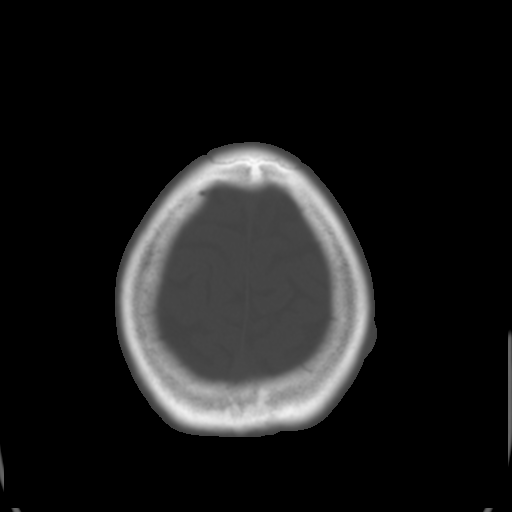
[im 28/31  brain]
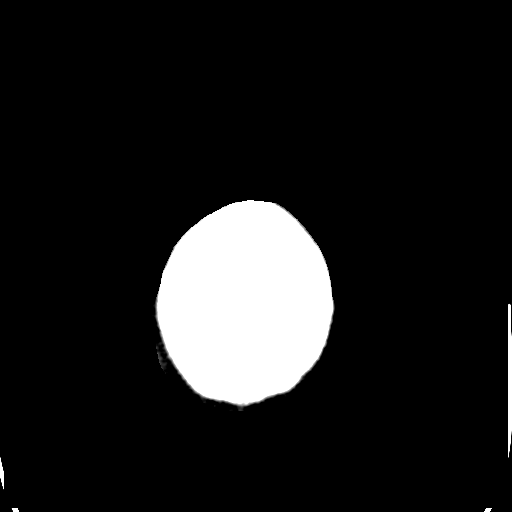

[Series 4: coronal soft tissue · coronal · 0.31mm/px · 3 of 70 slices shown]
[im 24/70  brain]
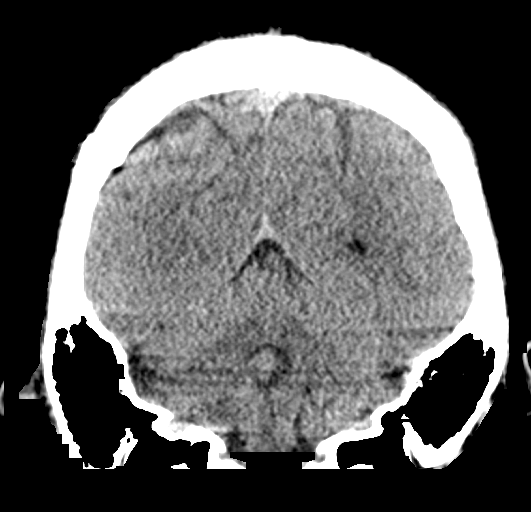
[im 31/70  brain]
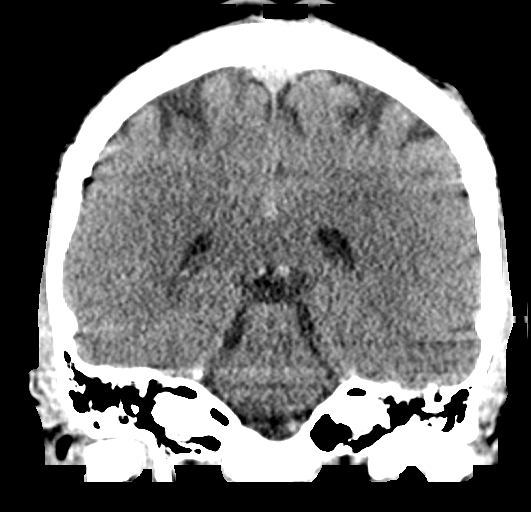
[im 39/70  brain]
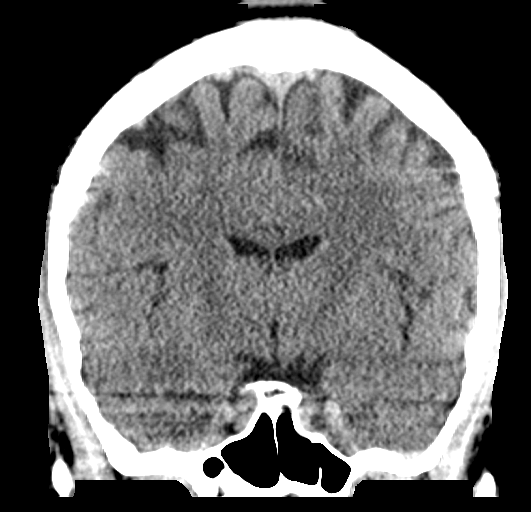

[Series 5: sagittal soft tissue · sagittal · 0.35mm/px · 3 of 53 slices shown]
[im 18/53  brain]
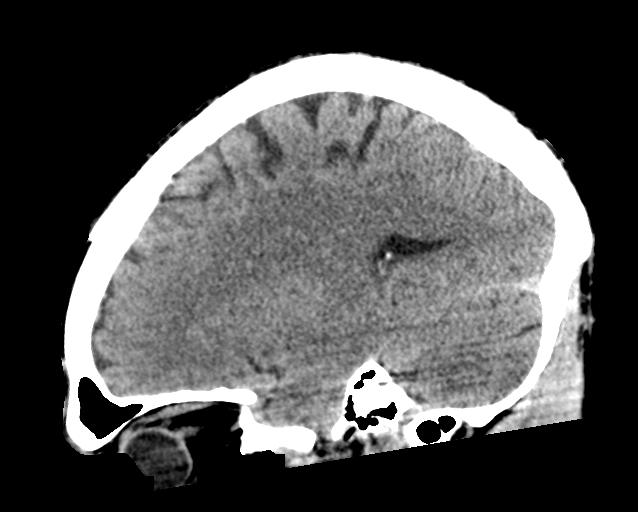
[im 27/53  brain]
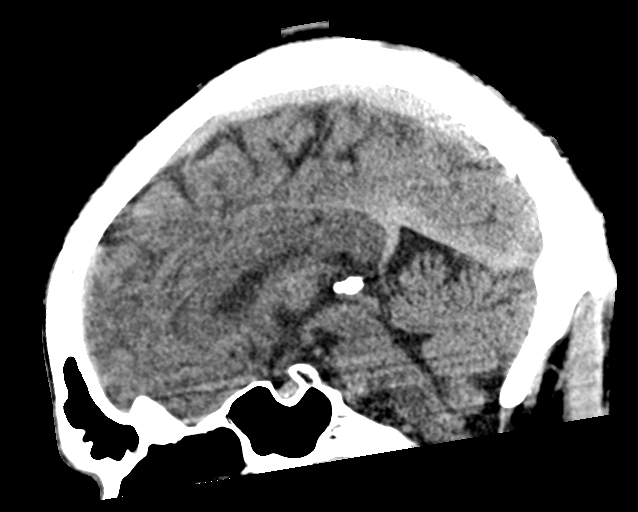
[im 35/53  brain]
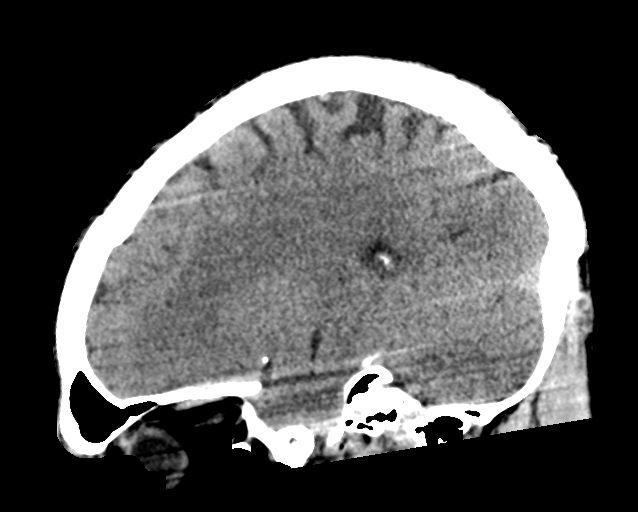

[16 of 47 positions shown; findings below may reference images not displayed]

FINDINGS: Brain: The ventricles are normal in size and configuration. There is
no intracranial mass, hemorrhage, extra-axial fluid collection, or
midline shift. Gray-white compartments appear normal.

Vascular: No hyperdense vessel. No appreciable arterial vascular
calcification.

Skull: There is a rather minimal right frontal scalp hematoma. Bony
calvarium appears intact.

Sinuses/Orbits: There is mucosal thickening in multiple ethmoid air
cells bilaterally. Opacification is noted in several ethmoid air
cells as well. There is slight mucosal thickening in each anterior
sphenoid region. Other visualized paranasal sinuses are clear.
Visualized orbits appear symmetric bilaterally.

Other: Visualized mastoid air cells are clear peer
IMPRESSION: Minimal right frontal scalp hematoma. No fracture. No intracranial
mass, hemorrhage, or extra-axial fluid collection. Gray-white
compartments appear normal. There are foci of paranasal sinus
disease, primarily in the ethmoid regions.

## 2019-12-03 ENCOUNTER — Emergency Department
Admission: EM | Admit: 2019-12-03 | Discharge: 2019-12-03 | Disposition: A | Discharge status: Home / Self Care | Payer: 59 | Attending: Emergency Medicine | Admitting: Emergency Medicine

## 2019-12-03 ENCOUNTER — Emergency Department: Payer: 59

## 2019-12-03 ENCOUNTER — Encounter: Payer: Self-pay | Admitting: Emergency Medicine

## 2019-12-03 ENCOUNTER — Other Ambulatory Visit: Payer: Self-pay

## 2019-12-03 DIAGNOSIS — R079 Chest pain, unspecified: Secondary | ICD-10-CM | POA: Diagnosis present

## 2019-12-03 DIAGNOSIS — Z79899 Other long term (current) drug therapy: Secondary | ICD-10-CM | POA: Insufficient documentation

## 2019-12-03 DIAGNOSIS — R42 Dizziness and giddiness: Secondary | ICD-10-CM | POA: Diagnosis not present

## 2019-12-03 DIAGNOSIS — F17210 Nicotine dependence, cigarettes, uncomplicated: Secondary | ICD-10-CM | POA: Insufficient documentation

## 2019-12-03 LAB — CBC
HCT: 42.7 % (ref 39.0–52.0)
Hemoglobin: 15.4 g/dL (ref 13.0–17.0)
MCH: 32.8 pg (ref 26.0–34.0)
MCHC: 36.1 g/dL — ABNORMAL HIGH (ref 30.0–36.0)
MCV: 90.9 fL (ref 80.0–100.0)
Platelets: 211 10*3/uL (ref 150–400)
RBC: 4.7 MIL/uL (ref 4.22–5.81)
RDW: 12.4 % (ref 11.5–15.5)
WBC: 4.5 10*3/uL (ref 4.0–10.5)
nRBC: 0 % (ref 0.0–0.2)

## 2019-12-03 LAB — BASIC METABOLIC PANEL
Anion gap: 10 (ref 5–15)
BUN: 13 mg/dL (ref 6–20)
CO2: 23 mmol/L (ref 22–32)
Calcium: 8.7 mg/dL — ABNORMAL LOW (ref 8.9–10.3)
Chloride: 105 mmol/L (ref 98–111)
Creatinine, Ser: 0.83 mg/dL (ref 0.61–1.24)
GFR calc Af Amer: >60 mL/min (ref >60)
GFR calc non Af Amer: >60 mL/min (ref >60)
Glucose, Bld: 129 mg/dL — ABNORMAL HIGH (ref 70–99)
Potassium: 3.4 mmol/L — ABNORMAL LOW (ref 3.5–5.1)
Sodium: 138 mmol/L (ref 135–145)

## 2019-12-03 LAB — TROPONIN I (HIGH SENSITIVITY)
Troponin I (High Sensitivity): 2 ng/L (ref <18)
Troponin I (High Sensitivity): 3 ng/L (ref <18)

## 2019-12-03 MED ORDER — FAMOTIDINE 20 MG PO TABS: 20.0000 mg | ORAL_TABLET | Freq: Every day | ORAL | 1 refills | Status: AC

## 2019-12-03 MED ORDER — SODIUM CHLORIDE 0.9% FLUSH: 3.0000 mL | Freq: Once | INTRAVENOUS | Status: DC

## 2019-12-03 MED ORDER — LIDOCAINE VISCOUS HCL 2 % MT SOLN
15.0000 mL | Freq: Once | OROMUCOSAL | Status: AC
  Administered 2019-12-03: 15 mL via OROMUCOSAL
  Filled 2019-12-03: qty 15

## 2019-12-03 NOTE — ED Notes (Signed)
No IV access needed per EDP

## 2019-12-03 NOTE — Discharge Instructions (Addendum)
Please seek medical attention for any high fevers, chest pain, shortness of breath, change in behavior, persistent vomiting, bloody stool or any other new or concerning symptoms.  

## 2019-12-03 NOTE — ED Notes (Signed)
Sig pad not working, pt verbalized receipt of paperwork and dc instructions.

## 2019-12-03 NOTE — ED Notes (Signed)
Pt's wife in room. No IV access needed at present per EDP.

## 2019-12-03 NOTE — ED Triage Notes (Signed)
Pt ambulatory to triage without difficulty states on his way to work he began to have left arm cramps and then chest pain and dizziness.  Pt describes chest pain as pressure and to the left chest.  States mild SHOB.

## 2019-12-03 NOTE — ED Provider Notes (Signed)
[facility] Emergency Department Provider Note   ____________________________________________   I have reviewed the triage vital signs and the nursing notes.   HISTORY  Chief Complaint Chest Pain and Dizziness   History limited by: Not Limited   HPI Vincent Vazquez is a 39 y.o. male who presents to the emergency department today because of concerns for chest pain.  Patient states that the symptoms started today while he was on his way to work.  He states he has been very stressed recently and work as part of it.  On his way to work he noticed some discomfort in his left upper chest.  He also noticed discomfort in his left arm.  He felt dizzy and lightheaded with the symptoms.  By the time my exam the symptoms have eased off.  He still having some discomfort in his arm.  Patient denies similar symptoms in the past.  He denies any history of high blood pressure hypertension or diabetes.  He does smoke and drink.  Denies any family history of heart disease.   Records reviewed. Per medical record review patient has a history of appendectomy  History reviewed. No pertinent past medical history.  There are no problems to display for this patient.   Past Surgical History:  Procedure Laterality Date  . APPENDECTOMY    . BACK SURGERY      Prior to Admission medications   Medication Sig Start Date End Date Taking? Authorizing Provider  etodolac (LODINE) 200 MG capsule Take 1 capsule (200 mg total) by mouth every 8 (eight) hours. 12/08/16   Rebecka Apley, MD  famotidine (PEPCID) 20 MG tablet Take 1 tablet (20 mg total) by mouth daily. 12/03/19 12/02/20  Phineas Semen, MD  naproxen (NAPROSYN) 500 MG tablet Take 1 tablet (500 mg total) by mouth 2 (two) times daily with a meal. 07/04/15   Joni Reining, PA-C  sulfamethoxazole-trimethoprim (BACTRIM DS,SEPTRA DS) 800-160 MG tablet Take 2 tablets by mouth 2 (two) times daily. 04/14/15   Irean Hong, MD  traMADol  (ULTRAM) 50 MG tablet Take 1 tablet (50 mg total) by mouth every 6 (six) hours as needed. 12/08/16   Rebecka Apley, MD  traMADol (ULTRAM) 50 MG tablet Take 1 tablet (50 mg total) by mouth every 6 (six) hours as needed for moderate pain. 03/17/17   Joni Reining, PA-C    Allergies Patient has no known allergies.  History reviewed. No pertinent family history.  Social History Social History   Tobacco Use  . Smoking status: Current Every Day Smoker    Packs/day: 1.00    Types: Cigarettes  . Smokeless tobacco: Never Used  Substance Use Topics  . Alcohol use: Yes  . Drug use: No    Review of Systems Constitutional: No fever/chills Eyes: No visual changes. ENT: No sore throat. Cardiovascular: Positive for chest pain. Respiratory: Positive for shortness of breath. Gastrointestinal: No abdominal pain.  No nausea, no vomiting.  No diarrhea.   Genitourinary: Negative for dysuria. Musculoskeletal: Positive for left arm pain. Skin: Negative for rash. Neurological: Negative for headaches, focal weakness or numbness.  ____________________________________________   PHYSICAL EXAM:  VITAL SIGNS: ED Triage Vitals  Enc Vitals Group     BP 12/03/19 1127 (!) 141/88     Pulse Rate 12/03/19 1127 94     Resp 12/03/19 1127 18     Temp 12/03/19 1127 98.5 F (36.9 C)     Temp Source 12/03/19 1127 Oral  SpO2 12/03/19 1127 97 %     Weight 12/03/19 1128 175 lb (79.4 kg)     Height 12/03/19 1128 6\' 3"  (1.905 m)     Head Circumference --      Peak Flow --      Pain Score 12/03/19 1127 8   Constitutional: Alert and oriented.  Eyes: Conjunctivae are normal.  ENT      Head: Normocephalic and atraumatic.      Nose: No congestion/rhinnorhea.      Mouth/Throat: Mucous membranes are moist.      Neck: No stridor. Hematological/Lymphatic/Immunilogical: No cervical lymphadenopathy. Cardiovascular: Normal rate, regular rhythm.  No murmurs, rubs, or gallops.  Respiratory: Normal  respiratory effort without tachypnea nor retractions. Breath sounds are clear and equal bilaterally. No wheezes/rales/rhonchi. Gastrointestinal: Soft and non tender. No rebound. No guarding.  Genitourinary: Deferred Musculoskeletal: Normal range of motion in all extremities. No lower extremity edema. Neurologic:  Normal speech and language. No gross focal neurologic deficits are appreciated.  Skin:  Skin is warm, dry and intact. No rash noted. Psychiatric: Mood and affect are normal. Speech and behavior are normal. Patient exhibits appropriate insight and judgment.  ____________________________________________    LABS (pertinent positives/negatives)  Trop hs 3 CBC wbc 4.5, hgb 15.4, plt 211 BMP wnl except k 3.4, glu 129, ca 8.7  ____________________________________________   EKG  I, Nance Pear, attending physician, personally viewed and interpreted this EKG  EKG Time: 1125 Rate: 90 Rhythm: normal sinus rhythm Axis: right superior axis deviation Intervals: qtc 452 QRS: incomplete RBBB ST changes: no st elevation Impression: abnormal ekg ____________________________________________    RADIOLOGY  CXR Lungs clear  ____________________________________________   PROCEDURES  Procedures  ____________________________________________   INITIAL IMPRESSION / ASSESSMENT AND PLAN / ED COURSE  Pertinent labs & imaging results that were available during my care of the patient were reviewed by me and considered in my medical decision making (see chart for details).   Patient presented to the emergency department today because of concerns for chest pain with some left arm pain and some shortness of breath.  Troponin was negative x2.  EKG while having some abnormalities did not show any ST elevation.  Chest x-ray without concerning findings.  Patient did feel some improvement after viscous lidocaine.  I did have a discussion with the patient.  At this point I do wonder if part  of the patient's symptoms are stress related.  Will give patient prescription for an acid.  I did however encourage patient and will give patient referral to cardiology.  Discussed return precautions.  ____________________________________________   FINAL CLINICAL IMPRESSION(S) / ED DIAGNOSES  Final diagnoses:  Nonspecific chest pain     Note: This dictation was prepared with Dragon dictation. Any transcriptional errors that result from this process are unintentional     Nance Pear, MD 12/03/19 1537

## 2021-11-29 IMAGING — CR DG CHEST 2V
2 series · 2 of 2 positions shown · non-contrast
Comparison: None.

CLINICAL DATA: Chest pain

EXAM:
CHEST - 2 VIEW

[chest pa]
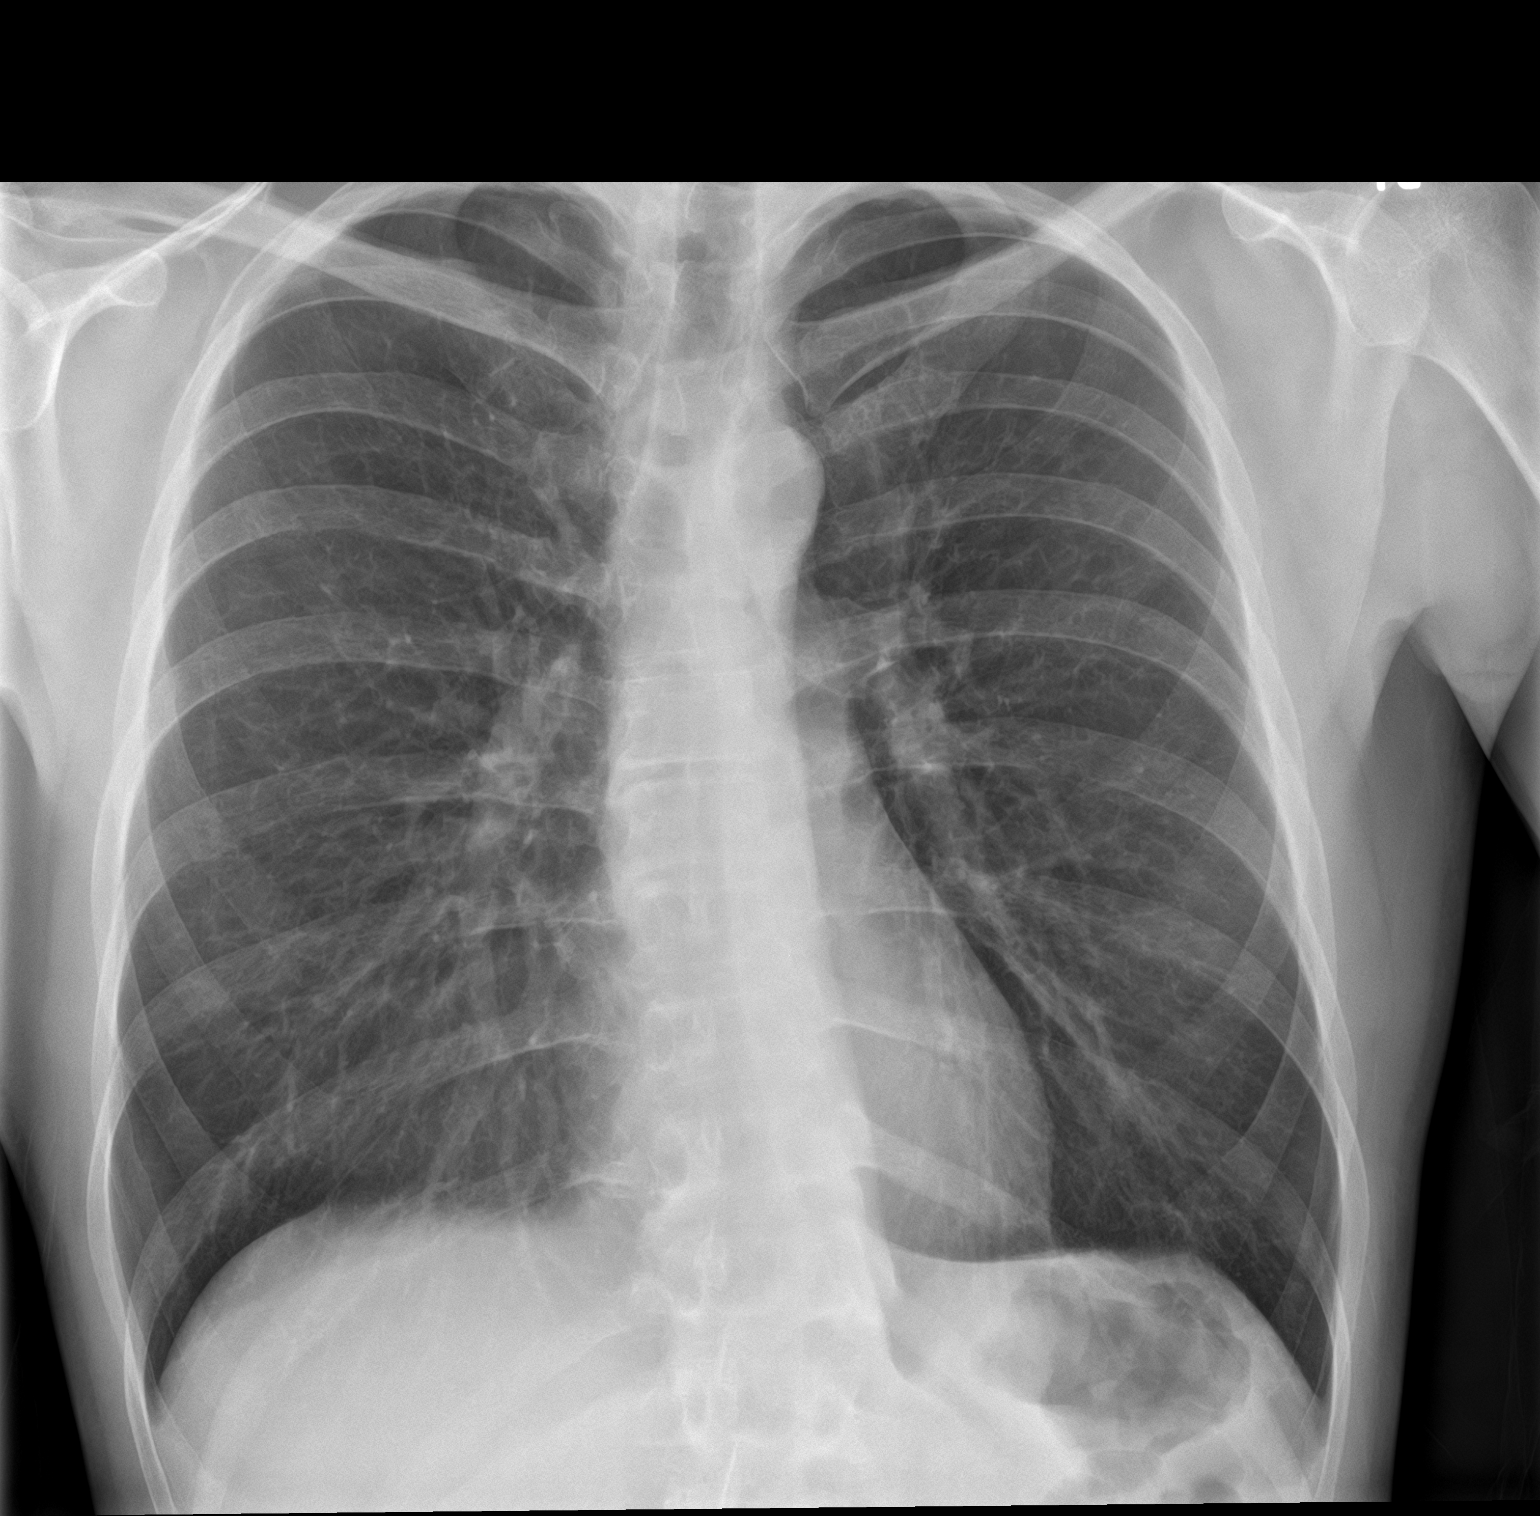

[chest lat]
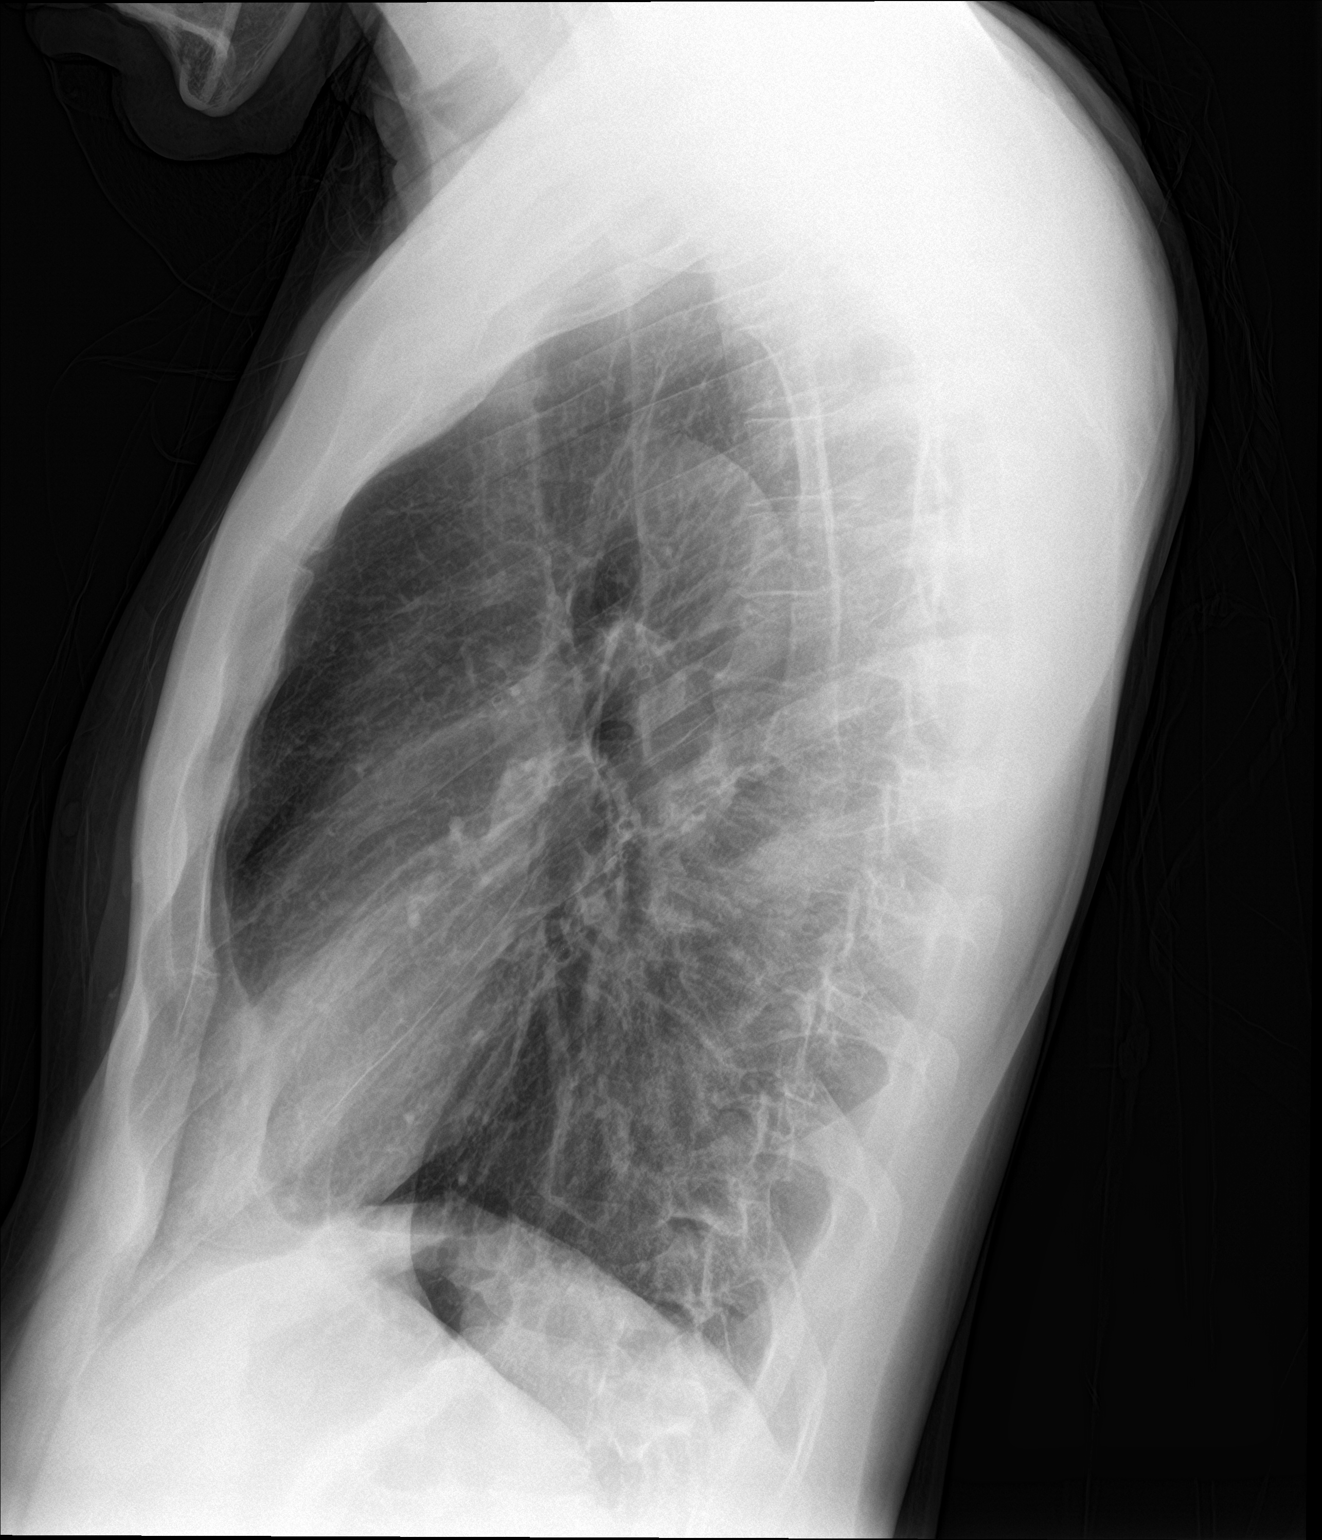

[2 of 2 positions shown; findings below may reference images not displayed]

FINDINGS: Lungs are clear. Heart size and pulmonary vascularity are normal. No
adenopathy. No pneumothorax. There is lower thoracic levoscoliosis.
IMPRESSION: Lungs clear.  Cardiac silhouette within normal limits.
